# Patient Record
Sex: Female | Born: 1995 | Race: Black or African American | Hispanic: No | Marital: Single
Health system: Southern US, Community
[De-identification: ages and names within clinical notes are randomized; demographics above are authoritative.]

---

## 2019-07-28 ENCOUNTER — Emergency Department
Admission: EM | Admit: 2019-07-28 | Discharge: 2019-07-28 | Disposition: A | Discharge status: Home / Self Care | Payer: Medicaid Other | Attending: Emergency Medicine | Admitting: Emergency Medicine

## 2019-07-28 ENCOUNTER — Emergency Department: Payer: Medicaid Other

## 2019-07-28 ENCOUNTER — Other Ambulatory Visit: Payer: Self-pay

## 2019-07-28 DIAGNOSIS — U071 COVID-19: Secondary | ICD-10-CM | POA: Insufficient documentation

## 2019-07-28 DIAGNOSIS — J111 Influenza due to unidentified influenza virus with other respiratory manifestations: Secondary | ICD-10-CM | POA: Diagnosis not present

## 2019-07-28 DIAGNOSIS — R52 Pain, unspecified: Secondary | ICD-10-CM | POA: Diagnosis present

## 2019-07-28 DIAGNOSIS — R079 Chest pain, unspecified: Secondary | ICD-10-CM

## 2019-07-28 MED ORDER — FAMOTIDINE 20 MG PO TABS: 20.0000 mg | ORAL_TABLET | Freq: Two times a day (BID) | ORAL | 0 refills | Status: AC

## 2019-07-28 MED ORDER — ONDANSETRON 4 MG PO TBDP: 4.0000 mg | ORAL_TABLET | Freq: Three times a day (TID) | ORAL | 0 refills | Status: AC | PRN

## 2019-07-28 MED ORDER — DEXAMETHASONE SODIUM PHOSPHATE 10 MG/ML IJ SOLN
10.0000 mg | Freq: Once | INTRAMUSCULAR | Status: AC
  Administered 2019-07-28: 10 mg via INTRAMUSCULAR
  Filled 2019-07-28: qty 1

## 2019-07-28 NOTE — ED Provider Notes (Signed)
[facility] Emergency Department Provider Note  ____________________________________________  Time seen: Approximately 10:05 PM  I have reviewed the triage vital signs and the nursing notes.   HISTORY  Chief Complaint Chest Pain    HPI Melinda Boyle is a 23 y.o. female with no significant past medical history who comes the ED complaining of body aches malaise loss of sense of taste and smell, decreased appetite.  She reports testing positive for Covid 4 days ago.  Since that time she has had progressive body aches and chills.  She is eating and drinking normally but reports that she has no appetite.  She is taking ibuprofen and Tylenol but still having body aches.  Triage note reports chest pain, but on direct questioning the patient reports that the chest does not hurt anymore than the rest of her body.  She does have some shortness of breath.  No pleuritic pain or exertional pain.  No past history of venous thromboembolism.  Pain is constant, waxing and waning, no aggravating or alleviating factors, moderate  intensity.      No past medical history on file.   There are no problems to display for this patient.       Prior to Admission medications   Medication Sig Start Date End Date Taking? Authorizing Provider  famotidine (PEPCID) 20 MG tablet Take 1 tablet (20 mg total) by mouth 2 (two) times daily. 07/28/19   Sharman Cheek, MD  ondansetron (ZOFRAN ODT) 4 MG disintegrating tablet Take 1 tablet (4 mg total) by mouth every 8 (eight) hours as needed for nausea or vomiting. 07/28/19   Sharman Cheek, MD     Allergies Amoxicillin   No family history on file.  Social History Social History   Tobacco Use  . Smoking status: Not on file  Substance Use Topics  . Alcohol use: Not on file  . Drug use: Not on file  No significant alcohol or drug use  Review of Systems  Constitutional:   No fever or chills.  ENT:   Positive  sore throat. No rhinorrhea. Cardiovascular:   No chest pain or syncope. Respiratory: Positive shortness of breath and occasional nonproductive cough. Gastrointestinal:   Negative for abdominal pain, vomiting and diarrhea.  Musculoskeletal:   Positive diffuse myalgia All other systems reviewed and are negative except as documented above in ROS and HPI.  ____________________________________________   PHYSICAL EXAM:  VITAL SIGNS: ED Triage Vitals  Enc Vitals Group     BP 07/28/19 2007 118/68     Pulse Rate 07/28/19 2007 97     Resp 07/28/19 2007 18     Temp 07/28/19 2007 99.8 F (37.7 C)     Temp Source 07/28/19 2005 Oral     SpO2 07/28/19 2007 99 %     Weight 07/28/19 2003 160 lb (72.6 kg)     Height 07/28/19 2003 5\' 5"  (1.651 m)     Head Circumference --      Peak Flow --      Pain Score 07/28/19 2003 9     Pain Loc --      Pain Edu? --      Excl. in GC? --     Vital signs reviewed, nursing assessments reviewed.   Constitutional:   Alert and oriented. Non-toxic appearance. Eyes:   Conjunctivae are normal. EOMI. PERRL. ENT      Head:   Normocephalic and atraumatic.      Nose:   Wearing a mask.  Mouth/Throat:   Wearing a mask.      Neck:   No meningismus. Full ROM. Hematological/Lymphatic/Immunilogical:   No cervical lymphadenopathy. Cardiovascular:   RRR. Symmetric bilateral radial and DP pulses.  No murmurs. Cap refill less than 2 seconds. Respiratory:   Normal respiratory effort without tachypnea/retractions. Breath sounds are clear and equal bilaterally. No wheezes/rales/rhonchi.  No inducible wheezing or cough with FEV1 maneuver Gastrointestinal:   Soft and nontender. Non distended. There is no CVA tenderness.  No rebound, rigidity, or guarding. Musculoskeletal:   Normal range of motion in all extremities. No joint effusions.  No lower extremity tenderness.  No edema. Neurologic:   Normal speech and language.  Motor grossly intact. No acute focal neurologic  deficits are appreciated.  Skin:    Skin is warm, dry and intact. No rash noted.  No petechiae, purpura, or bullae.  ____________________________________________    LABS (pertinent positives/negatives) (all labs ordered are listed, but only abnormal results are displayed) Labs Reviewed - No data to display ____________________________________________   EKG  Interpreted by me Normal sinus rhythm rate of 96, normal axis intervals QRS ST segments and T waves  ____________________________________________    RADIOLOGY  DG Chest Port 1 View  Result Date: 07/28/2019 CLINICAL DATA:  Chest pain.  COVID-19 positive. EXAM: PORTABLE CHEST 1 VIEW COMPARISON:  None. FINDINGS: The cardiomediastinal silhouette is within normal limits. The lungs are well inflated and clear. There is no evidence of pleural effusion or pneumothorax. No acute osseous abnormality is identified. IMPRESSION: No active disease. Electronically Signed   By: Logan Bores M.D.   On: 07/28/2019 21:17    ____________________________________________   PROCEDURES Procedures  ____________________________________________  DIFFERENTIAL DIAGNOSIS   Pneumonia, pneumothorax, pleural effusion, COVID-19 pneumonitis  CLINICAL IMPRESSION / ASSESSMENT AND PLAN / ED COURSE  Medications ordered in the ED: Medications  dexamethasone (DECADRON) injection 10 mg (10 mg Intramuscular Given 07/28/19 2141)    Pertinent labs & imaging results that were available during my care of the patient were reviewed by me and considered in my medical decision making (see chart for details).  Melinda Boyle was evaluated in Emergency Department on 07/28/2019 for the symptoms described in the history of present illness. She was evaluated in the context of the global COVID-19 pandemic, which necessitated consideration that the patient might be at risk for infection with the SARS-CoV-2 virus that causes COVID-19. Institutional protocols  and algorithms that pertain to the evaluation of patients at risk for COVID-19 are in a state of rapid change based on information released by regulatory bodies including the CDC and federal and state organizations. These policies and algorithms were followed during the patient's care in the ED.   Patient presents with known COVID-19 diagnosis.  She complains of body aches that are particularly bothersome.  Vital signs are normal, exam is reassuring and overall normal as well.  No evidence of DVT.Considering the patient's symptoms, medical history, and physical examination today, I have low suspicion for ACS, PE, TAD, pneumothorax, carditis, mediastinitis, pneumonia, CHF, or sepsis.  No evidence of any other infection or vascular phenomenon.  Not septic, stable to continue outpatient follow-up.  I am giving her a dose of intramuscular Decadron to help with her symptoms and appetite.      ____________________________________________   FINAL CLINICAL IMPRESSION(S) / ED DIAGNOSES    Final diagnoses:  COVID-19 virus infection  Influenza-like illness     ED Discharge Orders         Ordered  ondansetron (ZOFRAN ODT) 4 MG disintegrating tablet  Every 8 hours PRN     07/28/19 2205    famotidine (PEPCID) 20 MG tablet  2 times daily     07/28/19 2205          Portions of this note were generated with dragon dictation software. Dictation errors may occur despite best attempts at proofreading.   Sharman CheekStafford, Denzil Bristol, MD 07/28/19 2210

## 2019-07-28 NOTE — ED Triage Notes (Signed)
Patient reports tested COVID positive on 12/16.  Reports upper chest pain, nose bleeds and weakness.

## 2019-07-28 NOTE — ED Notes (Signed)
Discussed with Dr. Joni Fears, orders placed.

## 2019-12-26 ENCOUNTER — Other Ambulatory Visit: Payer: Self-pay

## 2019-12-26 ENCOUNTER — Emergency Department
Admission: EM | Admit: 2019-12-26 | Discharge: 2019-12-26 | Disposition: A | Discharge status: Home / Self Care | Payer: Medicaid Other | Attending: Emergency Medicine | Admitting: Emergency Medicine

## 2019-12-26 DIAGNOSIS — Z79899 Other long term (current) drug therapy: Secondary | ICD-10-CM | POA: Diagnosis not present

## 2019-12-26 DIAGNOSIS — L292 Pruritus vulvae: Secondary | ICD-10-CM | POA: Insufficient documentation

## 2019-12-26 DIAGNOSIS — N898 Other specified noninflammatory disorders of vagina: Secondary | ICD-10-CM

## 2019-12-26 LAB — URINALYSIS, COMPLETE (UACMP) WITH MICROSCOPIC
Bilirubin Urine: NEGATIVE
Glucose, UA: NEGATIVE mg/dL
Hgb urine dipstick: NEGATIVE
Ketones, ur: NEGATIVE mg/dL
Nitrite: NEGATIVE
Protein, ur: 30 mg/dL — AB
Specific Gravity, Urine: 1.031 — ABNORMAL HIGH (ref 1.005–1.030)
pH: 5 (ref 5.0–8.0)

## 2019-12-26 LAB — WET PREP, GENITAL
Clue Cells Wet Prep HPF POC: NONE SEEN
Sperm: NONE SEEN
Trich, Wet Prep: NONE SEEN
WBC, Wet Prep HPF POC: NONE SEEN
Yeast Wet Prep HPF POC: NONE SEEN

## 2019-12-26 LAB — POCT PREGNANCY, URINE: Preg Test, Ur: NEGATIVE

## 2019-12-26 LAB — CHLAMYDIA/NGC RT PCR (ARMC ONLY)
Chlamydia Tr: NOT DETECTED
N gonorrhoeae: NOT DETECTED

## 2019-12-26 NOTE — ED Triage Notes (Signed)
Pt comes in with recent yeast infection dx but states that since she started it the symptoms have gotten worse. Pt states itching and burning. Denies discharge, bleeding, or any pelvic pain. Has had herpes before and has taken her meds for an outbreak but states those aren't working either.

## 2019-12-26 NOTE — Discharge Instructions (Addendum)
Follow-up with [facility] for further evaluation of your vaginal problems.  Try over-the-counter probiotics and replenish.  These will help with some of the vaginal changes due to hormonal changes.  If your gonorrhea and Chlamydia test is positive I will call you for treatment.  If your urine test grows out bacteria we will call you and treat you then.

## 2019-12-26 NOTE — ED Provider Notes (Signed)
[facility] Emergency Department Provider Note  ____________________________________________   First MD Initiated Contact with Patient 12/26/19 1201     (approximate)  I have reviewed the triage vital signs and the nursing notes.   HISTORY  Chief Complaint Vaginal Itching    HPI Melinda Boyle is a 24 y.o. female presents emergency department complaining of vaginal itching.  She states there is no discharge or odor but she feels like everything is itchy.  History of herpes.  History of BV and yeast.  Patient states that she has not had sex since January.  States she is taking her medications as prescribed.  No fever or chills.    History reviewed. No pertinent past medical history.  There are no problems to display for this patient.     Prior to Admission medications   Medication Sig Start Date End Date Taking? Authorizing Provider  famotidine (PEPCID) 20 MG tablet Take 1 tablet (20 mg total) by mouth 2 (two) times daily. 07/28/19   Sharman Cheek, MD  ondansetron (ZOFRAN ODT) 4 MG disintegrating tablet Take 1 tablet (4 mg total) by mouth every 8 (eight) hours as needed for nausea or vomiting. 07/28/19   Sharman Cheek, MD    Allergies Amoxicillin  History reviewed. No pertinent family history.  Social History Social History   Tobacco Use  . Smoking status: Not on file  Substance Use Topics  . Alcohol use: Not on file  . Drug use: Not on file    Review of Systems  Constitutional: No fever/chills Eyes: No visual changes. ENT: No sore throat. Respiratory: Denies cough Gastrointestinal: Denies abdominal pain Genitourinary: Negative for dysuria. Musculoskeletal: Negative for back pain. Skin: Negative for rash. Psychiatric: no mood changes,     ____________________________________________   PHYSICAL EXAM:  VITAL SIGNS: ED Triage Vitals  Enc Vitals Group     BP 12/26/19 1141 (!) 119/59     Pulse Rate  12/26/19 1141 94     Resp 12/26/19 1141 18     Temp 12/26/19 1141 98.5 F (36.9 C)     Temp Source 12/26/19 1141 Oral     SpO2 12/26/19 1141 99 %     Weight 12/26/19 1142 165 lb (74.8 kg)     Height 12/26/19 1142 5\' 5"  (1.651 m)     Head Circumference --      Peak Flow --      Pain Score 12/26/19 1142 7     Pain Loc --      Pain Edu? --      Excl. in GC? --     Constitutional: Alert and oriented. Well appearing and in no acute distress. Eyes: Conjunctivae are normal.  Head: Atraumatic. Nose: No congestion/rhinnorhea. Mouth/Throat: Mucous membranes are moist.   Neck:  supple no lymphadenopathy noted Cardiovascular: Normal rate, regular rhythm. Heart sounds are normal Respiratory: Normal respiratory effort.  No retractions, lungs c t a  Abd: soft nontender bs normal all 4 quad GU: External exam is normal, speculum exam does not show any herpetic lesions, scant white milky discharge and some cervicitis, no cervical tenderness noted on bimanual Musculoskeletal: FROM all extremities, warm and well perfused Neurologic:  Normal speech and language.  Skin:  Skin is warm, dry and intact. No rash noted. Psychiatric: Mood and affect are normal. Speech and behavior are normal.  ____________________________________________   LABS (all labs ordered are listed, but only abnormal results are displayed)  Labs Reviewed  URINALYSIS, COMPLETE (UACMP) WITH MICROSCOPIC -  Abnormal; Notable for the following components:      Result Value   Color, Urine YELLOW (*)    APPearance CLOUDY (*)    Specific Gravity, Urine 1.031 (*)    Protein, ur 30 (*)    Leukocytes,Ua LARGE (*)    Bacteria, UA RARE (*)    All other components within normal limits  WET PREP, GENITAL  CHLAMYDIA/NGC RT PCR ([facility] ONLY)  URINE CULTURE  POC URINE PREG, ED  POCT PREGNANCY, URINE    ____________________________________________   ____________________________________________  RADIOLOGY    ____________________________________________   PROCEDURES  Procedure(s) performed: No  Procedures    ____________________________________________   INITIAL IMPRESSION / ASSESSMENT AND PLAN / ED COURSE  Pertinent labs & imaging results that were available during my care of the patient were reviewed by me and considered in my medical decision making (see chart for details).   Patient is 24 year old female presents emergency department complaint of vaginal itching.  See HPI  Physical exam shows patient to appear well.  Vaginal exam shows a thin white milky discharge with some cervical irritation.  Remainder the vaginal exam is normal  I did explain the findings to the patient.  We ordered a POC pregnancy, urinalysis, wet prep, GC/chlamydia.  Patient's questioning is doing the GC/chlamydia.  Explained to her that since the last practitioner had let her do a self swab that I feel it is more important to go ahead and make sure nothing was missed.  She states she understands.  We will proceed with the testing.  POC pregnancy is negative, urinalysis shows large amount of leuks but no bacteria, GC/chlamydia is negative, wet prep is negative  I did explain all the findings to the patient.  I do feel that we should wait on antibiotics until a urine culture shows if there is infection.  She has had so many disturbances with the vaginal flora I do not want to alter it anymore if not necessary.  She has no dysuria.  I feel that some of this may be from vaginal irritation due to stopping Depo a few months ago.  Explained to her that as her hormones change may have some more vaginal changes.  Encouraged her to take over-the-counter probiotics and she could also use replenish.  She is to follow-up with GYN for evaluation.  States she understands will comply.  She is discharged stable  condition.   Melinda Boyle was evaluated in Emergency Department on 12/26/2019 for the symptoms described in the history of present illness. She was evaluated in the context of the global COVID-19 pandemic, which necessitated consideration that the patient might be at risk for infection with the SARS-CoV-2 virus that causes COVID-19. Institutional protocols and algorithms that pertain to the evaluation of patients at risk for COVID-19 are in a state of rapid change based on information released by regulatory bodies including the CDC and federal and state organizations. These policies and algorithms were followed during the patient's care in the ED.   As part of my medical decision making, I reviewed the following data within the [place] notes reviewed and incorporated, Labs reviewed , Old chart reviewed, Notes from prior ED visits and [place] Controlled Substance Database  ____________________________________________   FINAL CLINICAL IMPRESSION(S) / ED DIAGNOSES  Final diagnoses:  Vaginal itching      NEW MEDICATIONS STARTED DURING THIS VISIT:  Discharge Medication List as of 12/26/2019  1:35 PM       Note:  This  document was prepared using Conservation officer, historic buildings and may include unintentional dictation errors.    Faythe Ghee, PA-C 12/26/19 1558    Concha Se, MD 12/26/19 [phone]

## 2019-12-27 LAB — URINE CULTURE

## 2021-05-26 IMAGING — DX DG CHEST 1V PORT
1 series · 1 of 1 positions shown · non-contrast
Comparison: None.

CLINICAL DATA: Chest pain.  [id number] positive.

EXAM:
PORTABLE CHEST 1 VIEW

[chest ap]
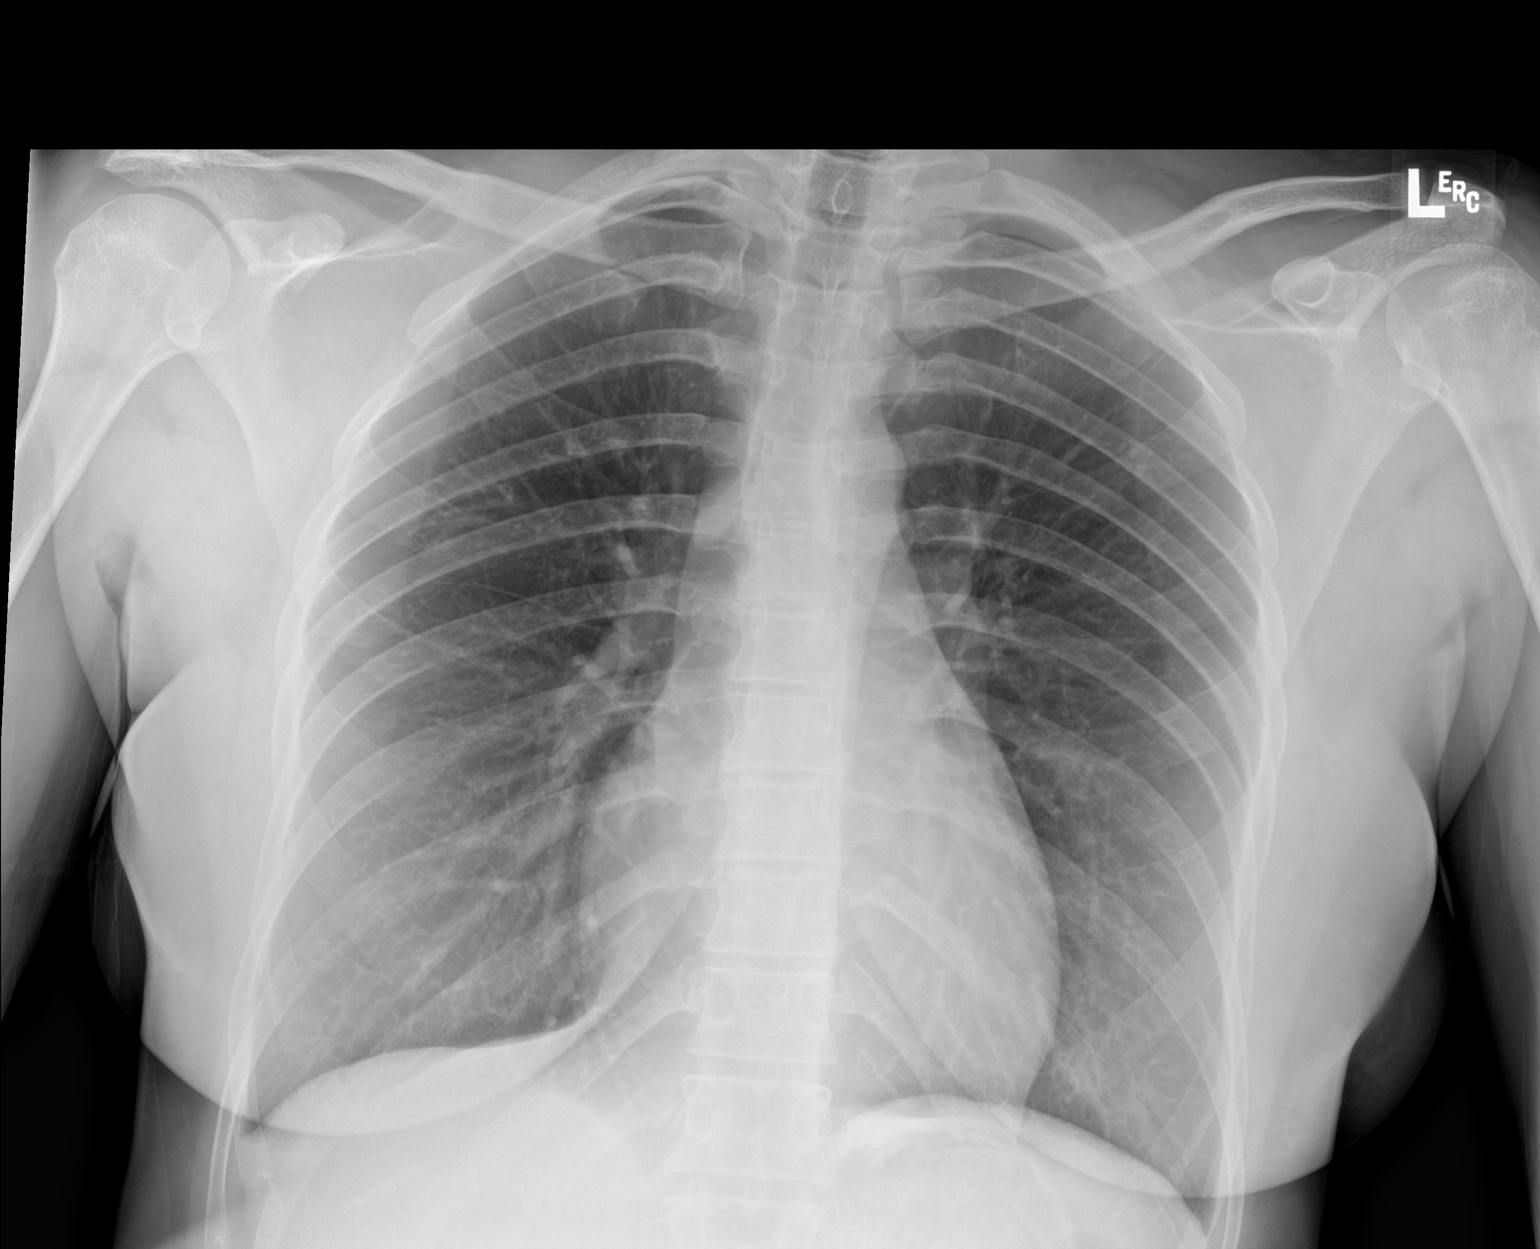

[1 of 1 positions shown; findings below may reference images not displayed]

FINDINGS: The cardiomediastinal silhouette is within normal limits. The lungs
are well inflated and clear. There is no evidence of pleural
effusion or pneumothorax. No acute osseous abnormality is
identified.
IMPRESSION: No active disease.
